# Patient Record
Sex: Female | Born: 1995 | Race: White | Hispanic: No | Marital: Single | Smoking: Never smoker
Health system: Southern US, Community
[De-identification: ages and names within clinical notes are randomized; demographics above are authoritative.]

## PROBLEM LIST (undated history)

## (undated) DIAGNOSIS — F988 Other specified behavioral and emotional disorders with onset usually occurring in childhood and adolescence: Secondary | ICD-10-CM

## (undated) DIAGNOSIS — J45909 Unspecified asthma, uncomplicated: Secondary | ICD-10-CM

## (undated) HISTORY — PX: TONSILLECTOMY: SUR1361

## (undated) HISTORY — PX: KNEE SURGERY: SHX244

---
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
## 2020-01-30 ENCOUNTER — Ambulatory Visit: Payer: Self-pay | Attending: Internal Medicine

## 2020-01-30 DIAGNOSIS — Z23 Encounter for immunization: Secondary | ICD-10-CM | POA: Insufficient documentation

## 2020-01-30 NOTE — Progress Notes (Signed)
   Covid-19 Vaccination Clinic  Name:  Jessica Barr    MRN: [id number] DOB: [birth year]  01/30/2020  [name] was observed post Covid-19 immunization for 15 minutes without incident. She was provided with Vaccine Information Sheet and instruction to access the V-Safe system.   [name] was instructed to call 911 with any severe reactions post vaccine: Marland Kitchen Difficulty breathing  . Swelling of face and throat  . A fast heartbeat  . A bad rash all over body  . Dizziness and weakness   Immunizations Administered    Name Date Dose VIS Date Route   Pfizer COVID-19 Vaccine 01/30/2020  3:15 PM 0.3 mL 11/06/2019 Intramuscular   Manufacturer: [employer], [employer]   Lot: [id number]   NDC: [address]-5

## 2020-02-20 ENCOUNTER — Ambulatory Visit: Payer: Self-pay

## 2020-02-22 ENCOUNTER — Ambulatory Visit: Payer: No Typology Code available for payment source | Attending: Internal Medicine

## 2020-02-22 ENCOUNTER — Ambulatory Visit: Payer: Self-pay

## 2020-02-22 DIAGNOSIS — Z23 Encounter for immunization: Secondary | ICD-10-CM

## 2020-02-22 NOTE — Progress Notes (Signed)
   Covid-19 Vaccination Clinic  Name:  Jessica Barr    MRN: [id number] DOB: [birth year]  02/22/2020  Jessica Barr was observed post Covid-19 immunization for 15 minutes without incident. She was provided with Vaccine Information Sheet and instruction to access the V-Safe system.   Jessica Barr was instructed to call 911 with any severe reactions post vaccine: Marland Kitchen Difficulty breathing  . Swelling of face and throat  . A fast heartbeat  . A bad rash all over body  . Dizziness and weakness   Immunizations Administered    Name Date Dose VIS Date Route   Pfizer COVID-19 Vaccine 02/22/2020 11:26 AM 0.3 mL 11/06/2019 Intramuscular   Manufacturer: [employer], [employer]   Lot: [id number]   NDC: [address]-9

## 2022-12-01 ENCOUNTER — Emergency Department (HOSPITAL_BASED_OUTPATIENT_CLINIC_OR_DEPARTMENT_OTHER): Payer: Managed Care, Other (non HMO)

## 2022-12-01 ENCOUNTER — Other Ambulatory Visit: Payer: Self-pay

## 2022-12-01 ENCOUNTER — Encounter (HOSPITAL_BASED_OUTPATIENT_CLINIC_OR_DEPARTMENT_OTHER): Payer: Self-pay | Admitting: Emergency Medicine

## 2022-12-01 ENCOUNTER — Emergency Department (HOSPITAL_BASED_OUTPATIENT_CLINIC_OR_DEPARTMENT_OTHER)
Admission: EM | Admit: 2022-12-01 | Discharge: 2022-12-01 | Disposition: A | Discharge status: Home / Self Care | Payer: Managed Care, Other (non HMO) | Attending: Emergency Medicine | Admitting: Emergency Medicine

## 2022-12-01 DIAGNOSIS — S161XXA Strain of muscle, fascia and tendon at neck level, initial encounter: Secondary | ICD-10-CM

## 2022-12-01 DIAGNOSIS — Z79899 Other long term (current) drug therapy: Secondary | ICD-10-CM | POA: Insufficient documentation

## 2022-12-01 DIAGNOSIS — J45909 Unspecified asthma, uncomplicated: Secondary | ICD-10-CM | POA: Diagnosis not present

## 2022-12-01 DIAGNOSIS — S199XXA Unspecified injury of neck, initial encounter: Secondary | ICD-10-CM | POA: Diagnosis present

## 2022-12-01 DIAGNOSIS — M25512 Pain in left shoulder: Secondary | ICD-10-CM | POA: Diagnosis not present

## 2022-12-01 DIAGNOSIS — R1012 Left upper quadrant pain: Secondary | ICD-10-CM | POA: Insufficient documentation

## 2022-12-01 DIAGNOSIS — R519 Headache, unspecified: Secondary | ICD-10-CM | POA: Diagnosis not present

## 2022-12-01 DIAGNOSIS — Y92410 Unspecified street and highway as the place of occurrence of the external cause: Secondary | ICD-10-CM | POA: Insufficient documentation

## 2022-12-01 DIAGNOSIS — R0789 Other chest pain: Secondary | ICD-10-CM

## 2022-12-01 LAB — CBC WITH DIFFERENTIAL/PLATELET
Abs Immature Granulocytes: 0.02 10*3/uL (ref 0.00–0.07)
Basophils Absolute: 0.1 10*3/uL (ref 0.0–0.1)
Basophils Relative: 1 %
Eosinophils Absolute: 0.1 10*3/uL (ref 0.0–0.5)
Eosinophils Relative: 1 %
HCT: 42 % (ref 36.0–46.0)
Hemoglobin: 13.4 g/dL (ref 12.0–15.0)
Immature Granulocytes: 0 %
Lymphocytes Relative: 27 %
Lymphs Abs: 2.6 10*3/uL (ref 0.7–4.0)
MCH: 25.4 pg — ABNORMAL LOW (ref 26.0–34.0)
MCHC: 31.9 g/dL (ref 30.0–36.0)
MCV: 79.5 fL — ABNORMAL LOW (ref 80.0–100.0)
Monocytes Absolute: 0.6 10*3/uL (ref 0.1–1.0)
Monocytes Relative: 7 %
Neutro Abs: 6.3 10*3/uL (ref 1.7–7.7)
Neutrophils Relative %: 64 %
Platelets: 358 10*3/uL (ref 150–400)
RBC: 5.28 MIL/uL — ABNORMAL HIGH (ref 3.87–5.11)
RDW: 13.1 % (ref 11.5–15.5)
WBC: 9.8 10*3/uL (ref 4.0–10.5)
nRBC: 0 % (ref 0.0–0.2)

## 2022-12-01 LAB — COMPREHENSIVE METABOLIC PANEL
ALT: 15 U/L (ref 0–44)
AST: 19 U/L (ref 15–41)
Albumin: 4.4 g/dL (ref 3.5–5.0)
Alkaline Phosphatase: 69 U/L (ref 38–126)
Anion gap: 8 (ref 5–15)
BUN: 15 mg/dL (ref 6–20)
CO2: 26 mmol/L (ref 22–32)
Calcium: 9.4 mg/dL (ref 8.9–10.3)
Chloride: 102 mmol/L (ref 98–111)
Creatinine, Ser: 0.61 mg/dL (ref 0.44–1.00)
GFR, Estimated: >60 mL/min (ref >60)
Glucose, Bld: 125 mg/dL — ABNORMAL HIGH (ref 70–99)
Potassium: 3.6 mmol/L (ref 3.5–5.1)
Sodium: 136 mmol/L (ref 135–145)
Total Bilirubin: 0.5 mg/dL (ref 0.3–1.2)
Total Protein: 7.6 g/dL (ref 6.5–8.1)

## 2022-12-01 LAB — LIPASE, BLOOD: Lipase: 30 U/L (ref 11–51)

## 2022-12-01 LAB — PREGNANCY, URINE: Preg Test, Ur: NEGATIVE

## 2022-12-01 MED ORDER — IOHEXOL 300 MG/ML  SOLN
100.0000 mL | Freq: Once | INTRAMUSCULAR | Status: AC | PRN
  Administered 2022-12-01: 100 mL via INTRAVENOUS

## 2022-12-01 MED ORDER — CYCLOBENZAPRINE HCL 10 MG PO TABS: 10.0000 mg | ORAL_TABLET | Freq: Two times a day (BID) | ORAL | 0 refills | Status: AC | PRN

## 2022-12-01 MED ORDER — ACETAMINOPHEN 325 MG PO TABS
650.0000 mg | ORAL_TABLET | Freq: Once | ORAL | Status: AC
  Administered 2022-12-01: 650 mg via ORAL
  Filled 2022-12-01: qty 2
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
## 2022-12-01 NOTE — Discharge Instructions (Signed)
For your pain, you may take up to 1000mg of acetaminophen (tylenol) 4 times daily for up to a week. This is the maximum dose of acetminophen (tylenol) you can take from all sources. Please check other over-the-counter medications and prescriptions to ensure you are not taking other medications that contain acetaminophen.  You may also take ibuprofen 400 mg 6 times a day OR 600mg 4 times a day alternating with or at the same time as tylenol.   
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
